# Patient Record
Sex: Male | Born: 1970 | Race: White | Hispanic: No | Marital: Married
Health system: Southern US, Community
[De-identification: ages and names within clinical notes are randomized; demographics above are authoritative.]

---

## 2019-03-17 ENCOUNTER — Encounter (HOSPITAL_COMMUNITY): Payer: Self-pay | Admitting: Student

## 2019-03-17 ENCOUNTER — Emergency Department (HOSPITAL_COMMUNITY): Payer: Managed Care, Other (non HMO)

## 2019-03-17 ENCOUNTER — Emergency Department (HOSPITAL_COMMUNITY)
Admission: EM | Admit: 2019-03-17 | Discharge: 2019-03-17 | Disposition: A | Discharge status: Home / Self Care | Payer: Managed Care, Other (non HMO) | Attending: Emergency Medicine | Admitting: Emergency Medicine

## 2019-03-17 DIAGNOSIS — R7989 Other specified abnormal findings of blood chemistry: Secondary | ICD-10-CM | POA: Insufficient documentation

## 2019-03-17 DIAGNOSIS — R509 Fever, unspecified: Secondary | ICD-10-CM | POA: Insufficient documentation

## 2019-03-17 DIAGNOSIS — R0602 Shortness of breath: Secondary | ICD-10-CM | POA: Diagnosis present

## 2019-03-17 DIAGNOSIS — U071 COVID-19: Secondary | ICD-10-CM | POA: Diagnosis not present

## 2019-03-17 LAB — BASIC METABOLIC PANEL
Anion gap: 13 (ref 5–15)
BUN: 14 mg/dL (ref 6–20)
CO2: 23 mmol/L (ref 22–32)
Calcium: 9.1 mg/dL (ref 8.9–10.3)
Chloride: 100 mmol/L (ref 98–111)
Creatinine, Ser: 1.53 mg/dL — ABNORMAL HIGH (ref 0.61–1.24)
GFR calc Af Amer: >60 mL/min (ref >60)
GFR calc non Af Amer: 52 mL/min — ABNORMAL LOW (ref >60)
Glucose, Bld: 94 mg/dL (ref 70–99)
Potassium: 3.9 mmol/L (ref 3.5–5.1)
Sodium: 136 mmol/L (ref 135–145)

## 2019-03-17 LAB — CBC
HCT: 55.7 % — ABNORMAL HIGH (ref 39.0–52.0)
Hemoglobin: 18.8 g/dL — ABNORMAL HIGH (ref 13.0–17.0)
MCH: 31.1 pg (ref 26.0–34.0)
MCHC: 33.8 g/dL (ref 30.0–36.0)
MCV: 92.1 fL (ref 80.0–100.0)
Platelets: 167 10*3/uL (ref 150–400)
RBC: 6.05 MIL/uL — ABNORMAL HIGH (ref 4.22–5.81)
RDW: 13.2 % (ref 11.5–15.5)
WBC: 6.7 10*3/uL (ref 4.0–10.5)
nRBC: 0 % (ref 0.0–0.2)

## 2019-03-17 LAB — TROPONIN I (HIGH SENSITIVITY)
Troponin I (High Sensitivity): 6 ng/L (ref <18)
Troponin I (High Sensitivity): 5 ng/L (ref <18)

## 2019-03-17 MED ORDER — AEROCHAMBER PLUS FLO-VU MEDIUM MISC
1.0000 | Freq: Once | Status: DC
  Filled 2019-03-17: qty 1

## 2019-03-17 MED ORDER — BENZONATATE 100 MG PO CAPS: 100.0000 mg | ORAL_CAPSULE | Freq: Three times a day (TID) | ORAL | 0 refills | Status: AC | PRN

## 2019-03-17 MED ORDER — ALBUTEROL SULFATE HFA 108 (90 BASE) MCG/ACT IN AERS
2.0000 | INHALATION_SPRAY | Freq: Once | RESPIRATORY_TRACT | Status: AC
  Administered 2019-03-17: 2 via RESPIRATORY_TRACT
  Filled 2019-03-17: qty 6.7

## 2019-03-17 NOTE — ED Provider Notes (Signed)
[name] EMERGENCY DEPARTMENT Provider Note   CSN: [id number] Arrival date & time: 03/17/19  1134     History Chief Complaint  Patient presents with  . Shortness of Breath    Daniel Dillon is a 49 y.o. male with a history of hypercholesterolemia who presents to the ED due to concern for continued fever with known diagnoses of covid 19. Patient states he has been feeling poorly for about 1 week with fever to 102, chills, body aches, nasal congestion, cough, dyspnea with exertion, and chest discomfort described as feeling as though there is a rock in his chest @ times. Other than exertion no alleviating/aggravating factors. Seen @ clinic 01/11- covid swab obtained, sent home with prednisone & azithromycin which he has 2 and 1 day left of respectively which he has been taking without relief. Today received call his COVID test was positive- given he did not feel any better they instructed him to come to the ED. He states it is really that his fever is not going away in terms of sxs not getting better. Denies N/V/D, abdominal pain, syncope,  leg pain/swelling, hemoptysis, recent surgery/trauma, recent long travel, hormone use, personal hx of cancer, or hx of DVT/PE.    HPI     History reviewed. No pertinent past medical history.  There are no problems to display for this patient.   History reviewed. No pertinent surgical history.     History reviewed. No pertinent family history.  Social History   Tobacco Use  . Smoking status: Not on file  Substance Use Topics  . Alcohol use: Not on file  . Drug use: Not on file    Home Medications Prior to Admission medications   Not on File    Allergies    Patient has no allergy information on record.  Review of Systems   Review of Systems  Constitutional: Positive for chills and fever.  HENT: Positive for congestion. Negative for ear pain and sore throat.   Respiratory: Positive for cough and shortness of  breath.   Cardiovascular: Positive for chest pain. Negative for leg swelling.  Gastrointestinal: Negative for abdominal pain, diarrhea, nausea and vomiting.  Genitourinary: Negative for dysuria.  Musculoskeletal: Positive for myalgias (generalized).  Neurological: Negative for syncope.  All other systems reviewed and are negative.   Physical Exam Updated Vital Signs BP 131/89   Pulse 79   Temp 99 F (37.2 C) (Oral)   Resp 14   Ht 5\' 11"  (1.803 m)   Wt 102.1 kg   SpO2 94%   BMI 31.38 kg/m   Physical Exam Vitals and nursing note reviewed.  Constitutional:      General: He is not in acute distress.    Appearance: He is well-developed. He is not toxic-appearing.  HENT:     Head: Normocephalic and atraumatic.     Right Ear: Ear canal normal. Tympanic membrane is not perforated, erythematous, retracted or bulging.     Left Ear: Ear canal normal. Tympanic membrane is not perforated, erythematous, retracted or bulging.     Ears:     Comments: No mastoid erythema/swellng/tenderness.     Nose:     Right Sinus: No maxillary sinus tenderness or frontal sinus tenderness.     Left Sinus: No maxillary sinus tenderness or frontal sinus tenderness.     Mouth/Throat:     Pharynx: Oropharynx is clear. Uvula midline. No oropharyngeal exudate or posterior oropharyngeal erythema.     Comments: Posterior oropharynx is  symmetric appearing. Patient tolerating own secretions without difficulty. No trismus. No drooling. No hot potato voice. No swelling beneath the tongue, submandibular compartment is soft.  Eyes:     General:        Right eye: No discharge.        Left eye: No discharge.     Conjunctiva/sclera: Conjunctivae normal.  Cardiovascular:     Rate and Rhythm: Normal rate and regular rhythm.  Pulmonary:     Effort: Pulmonary effort is normal. No respiratory distress.     Breath sounds: Normal breath sounds. No wheezing, rhonchi or rales.  Abdominal:     General: There is no  distension.     Palpations: Abdomen is soft.     Tenderness: There is no abdominal tenderness.  Musculoskeletal:     Cervical back: Neck supple. No rigidity.     Right lower leg: No tenderness. No edema.     Left lower leg: No tenderness. No edema.  Lymphadenopathy:     Cervical: No cervical adenopathy.  Skin:    General: Skin is warm and dry.     Findings: No rash.  Neurological:     Mental Status: He is alert.  Psychiatric:        Behavior: Behavior normal.    ED Results / Procedures / Treatments   Labs (all labs ordered are listed, but only abnormal results are displayed) Labs Reviewed  CBC - Abnormal; Notable for the following components:      Result Value   RBC 6.05 (*)    Hemoglobin 18.8 (*)    HCT 55.7 (*)    All other components within normal limits  BASIC METABOLIC PANEL - Abnormal; Notable for the following components:   Creatinine, Ser 1.53 (*)    GFR calc non Af Amer 52 (*)    All other components within normal limits  TROPONIN I (HIGH SENSITIVITY)  TROPONIN I (HIGH SENSITIVITY)    EKG None  Radiology DG Chest Port 1 View  Result Date: 03/17/2019 CLINICAL DATA:  COVID positive, shortness of breath EXAM: PORTABLE CHEST 1 VIEW COMPARISON:  None. FINDINGS: Cardiomegaly. No acute appearing airspace opacity. The visualized skeletal structures are unremarkable. IMPRESSION: Cardiomegaly without acute abnormality of the lungs in AP portable projection. Electronically Signed   By: Lauralyn Primes M.D.   On: 03/17/2019 14:19    Procedures Procedures (including critical care time)  Medications Ordered in ED Medications  AeroChamber Plus Flo-Vu Medium MISC 1 each (0 each Other Hold 03/17/19 1309)  albuterol (VENTOLIN HFA) 108 (90 Base) MCG/ACT inhaler 2 puff (2 puffs Inhalation Given 03/17/19 1207)    ED Course  I have reviewed the triage vital signs and the nursing notes.  Pertinent labs & imaging results that were available during my care of the patient were  reviewed by me and considered in my medical decision making (see chart for details).    Daniel Dillon was evaluated in Emergency Department on 03/17/2019 for the symptoms described in the history of present illness. He/she was evaluated in the context of the global COVID-19 pandemic, which necessitated consideration that the patient might be at risk for infection with the SARS-CoV-2 virus that causes COVID-19. Institutional protocols and algorithms that pertain to the evaluation of patients at risk for COVID-19 are in a state of rapid change based on information released by regulatory bodies including the CDC and federal and state organizations. These policies and algorithms were followed during the patient's care in the ED.  MDM Rules/Calculators/A&P                      Patient with known positive COVID-19 testing presents to the emergency department for persistent fever and no significant improvement in his symptoms overall.  Patient is nontoxic-appearing, resting comfortably, vitals without significant abnormality.  Given his chest discomfort plan for EKG, chest x-ray, and labs to include troponin.  EKG: No STEMI CBC: No leukocytosis.  Hemoglobin/hematocrit elevated.  No anemia. BMP: Creatinine elevated, no recent on record, PCP follow-up. Troponin: 6, 5 Chest x-ray: Cardiomegaly without acute abnormality of the lungs and AP portable projection.  Low risk heart pathway score, EKG no STEMI, troponins without elevation- doubt ACS.  Low risk Wells, doubt PE.  No significant infiltrates on chest x-ray.  I personally ambulated patient throughout his exam room with SPO2 maintaining greater than 92%.  Patient overall appears appropriate for discharge home.  We will have him take home inhaler provided in the emergency department and provide Tessalon.  Continue Tylenol at home as needed for fever/discomfort. I discussed results, treatment plan, need for follow-up, and return precautions with the patient.  Provided opportunity for questions, patient confirmed understanding and is in agreement with plan.   Final Clinical Impression(s) / ED Diagnoses Final diagnoses:  [id number]  Elevated serum creatinine    Rx / DC Orders ED Discharge Orders         Ordered    benzonatate (TESSALON) 100 MG capsule  3 times daily PRN     03/17/19 1611           Niklaus Mamaril, [place] R, PA-C 03/17/19 1641    Maudie Flakes, MD 03/21/19 1507

## 2019-03-17 NOTE — ED Triage Notes (Signed)
Pt presents from home for positive covid test, increasing SOB with exertion and CP with associated with cough. Diagnosed positive Monday, sent by PCP for sx possibly indicating PNU, sent for CXR. During triage, pt walked in room with EDP while wearing pulse ox, spo2 remained 93% or greater during this activity.  Denies h/o blood clots, cardiac hx

## 2019-03-17 NOTE — ED Notes (Signed)
Patient verbalizes understanding of discharge instructions. Opportunity for questioning and answers were provided. Armband removed by staff, pt discharged from ED to home with wife 

## 2019-03-17 NOTE — Discharge Instructions (Signed)
You were seen in the emergency department today for coronavirus.  Your work-up was overall reassuring.  Your labs did show that your creatinine (a measure of kidney function) to follow your hemoglobin/hematocrit (blood counts) were each somewhat elevated, this may be due to dehydration, be sure to drink plenty of water, have these rechecked by primary care within 1 to 2 weeks.  Your chest x-ray showed that you had mild cardiomegaly meaning that your heart is mildly enlarged but this is not necessarily an acute problem, also have this rechecked with your primary care follow-up.  Your EKG and heart enzymes did not show signs of a heart attack.  Your chest x-ray did not show pneumonia.  Your oxygen saturations were reassuring in the emergency department.  We are sending you home with inhaler to use 1 to 2 puffs as needed for shortness of breath/wheezing every 4-6 hours as well as Tessalon to use for cough as needed every 8 hours.  We have prescribed you new medication(s) today. Discuss the medications prescribed today with your pharmacist as they can have adverse effects and interactions with your other medicines including over the counter and prescribed medications. Seek medical evaluation if you start to experience new or abnormal symptoms after taking one of these medicines, seek care immediately if you start to experience difficulty breathing, feeling of your throat closing, facial swelling, or rash as these could be indications of a more serious allergic reaction  Given your elevated kidney function please avoid anti-inflammatory such as ibuprofen, Advil, Aleve, Motrin, Goody powder, please take Tylenol instead probably counter dosing to help with discomfort as well as fevers.  We are instructing patient's with COVID 19 or symptoms of COVID 19 to quarantine themselves for 14 days. You may be able to discontinue self quarantine if the following conditions are met:   Persons with COVID-19 who have symptoms  and were directed to care for themselves at home may discontinue home isolation under the  following conditions: - It has been at least 7 days have passed since symptoms first appeared. - AND at least 3 days (72 hours) have passed since recovery defined as resolution of fever without the use of fever-reducing medications and improvement in respiratory symptoms (e.g., cough, shortness of breath)  Please follow the below quarantine instructions.   Please follow up with primary care within 3-5 days for re-evaluation- call prior to going to the office to make them aware of your symptoms as some offices are altering their method of seeing patients with COVID 19 symptoms. Return to the ER for new or worsening symptoms including but not limited to increased work of breathing, chest pain, passing out, or any other concerns.       Person Under Monitoring Name: Daniel Dillon  Location: [place] [address]   Infection Prevention Recommendations for Individuals Confirmed to have, or Being Evaluated for, 2019 Novel Coronavirus (COVID-19) Infection Who Receive Care at Home  Individuals who are confirmed to have, or are being evaluated for, COVID-19 should follow the prevention steps below until a healthcare provider or local or state health department says they can return to normal activities.  Stay home except to get medical care You should restrict activities outside your home, except for getting medical care. Do not go to work, school, or public areas, and do not use public transportation or taxis.  Call ahead before visiting your doctor Before your medical appointment, call the healthcare provider and tell them that you  have, or are being evaluated for, COVID-19 infection. This will help the healthcare provider's office take steps to keep other people from getting infected. Ask your healthcare provider to call the local or state health department.  Monitor your  symptoms Seek prompt medical attention if your illness is worsening (e.g., difficulty breathing). Before going to your medical appointment, call the healthcare provider and tell them that you have, or are being evaluated for, COVID-19 infection. Ask your healthcare provider to call the local or state health department.  Wear a facemask You should wear a facemask that covers your nose and mouth when you are in the same room with other people and when you visit a healthcare provider. People who live with or visit you should also wear a facemask while they are in the same room with you.  Separate yourself from other people in your home As much as possible, you should stay in a different room from other people in your home. Also, you should use a separate bathroom, if available.  Avoid sharing household items You should not share dishes, drinking glasses, cups, eating utensils, towels, bedding, or other items with other people in your home. After using these items, you should wash them thoroughly with soap and water.  Cover your coughs and sneezes Cover your mouth and nose with a tissue when you cough or sneeze, or you can cough or sneeze into your sleeve. Throw used tissues in a lined trash can, and immediately wash your hands with soap and water for at least 20 seconds or use an alcohol-based hand rub.  Wash your [employer] your hands often and thoroughly with soap and water for at least 20 seconds. You can use an alcohol-based hand sanitizer if soap and water are not available and if your hands are not visibly dirty. Avoid touching your eyes, nose, and mouth with unwashed hands.   Prevention Steps for Caregivers and Household Members of Individuals Confirmed to have, or Being Evaluated for, COVID-19 Infection Being Cared for in the Home  If you live with, or provide care at home for, a person confirmed to have, or being evaluated for, COVID-19 infection please follow these guidelines  to prevent infection:  Follow healthcare provider's instructions Make sure that you understand and can help the patient follow any healthcare provider instructions for all care.  Provide for the patient's basic needs You should help the patient with basic needs in the home and provide support for getting groceries, prescriptions, and other personal needs.  Monitor the patient's symptoms If they are getting sicker, call his or her medical provider and tell them that the patient has, or is being evaluated for, COVID-19 infection. This will help the healthcare provider's office take steps to keep other people from getting infected. Ask the healthcare provider to call the local or state health department.  Limit the number of people who have contact with the patient If possible, have only one caregiver for the patient. Other household members should stay in another home or place of residence. If this is not possible, they should stay in another room, or be separated from the patient as much as possible. Use a separate bathroom, if available. Restrict visitors who do not have an essential need to be in the home.  Keep older adults, very young children, and other sick people away from the patient Keep older adults, very young children, and those who have compromised immune systems or chronic health conditions away from the patient. This includes  people with chronic heart, lung, or kidney conditions, diabetes, and cancer.  Ensure good ventilation Make sure that shared spaces in the home have good air flow, such as from an air conditioner or an opened window, weather permitting.  Wash your hands often Wash your hands often and thoroughly with soap and water for at least 20 seconds. You can use an alcohol based hand sanitizer if soap and water are not available and if your hands are not visibly dirty. Avoid touching your eyes, nose, and mouth with unwashed hands. Use disposable paper towels to  dry your hands. If not available, use dedicated cloth towels and replace them when they become wet.  Wear a facemask and gloves Wear a disposable facemask at all times in the room and gloves when you touch or have contact with the patient's blood, body fluids, and/or secretions or excretions, such as sweat, saliva, sputum, nasal mucus, vomit, urine, or feces.  Ensure the mask fits over your nose and mouth tightly, and do not touch it during use. Throw out disposable facemasks and gloves after using them. Do not reuse. Wash your hands immediately after removing your facemask and gloves. If your personal clothing becomes contaminated, carefully remove clothing and launder. Wash your hands after handling contaminated clothing. Place all used disposable facemasks, gloves, and other waste in a lined container before disposing them with other household waste. Remove gloves and wash your hands immediately after handling these items.  Do not share dishes, glasses, or other household items with the patient Avoid sharing household items. You should not share dishes, drinking glasses, cups, eating utensils, towels, bedding, or other items with a patient who is confirmed to have, or being evaluated for, COVID-19 infection. After the person uses these items, you should wash them thoroughly with soap and water.  Wash laundry thoroughly Immediately remove and wash clothes or bedding that have blood, body fluids, and/or secretions or excretions, such as sweat, saliva, sputum, nasal mucus, vomit, urine, or feces, on them. Wear gloves when handling laundry from the patient. Read and follow directions on labels of laundry or clothing items and detergent. In general, wash and dry with the warmest temperatures recommended on the label.  Clean all areas the individual has used often Clean all touchable surfaces, such as counters, tabletops, doorknobs, bathroom fixtures, toilets, phones, keyboards, tablets, and bedside  tables, every day. Also, clean any surfaces that may have blood, body fluids, and/or secretions or excretions on them. Wear gloves when cleaning surfaces the patient has come in contact with. Use a diluted bleach solution (e.g., dilute bleach with 1 part bleach and 10 parts water) or a household disinfectant with a label that says EPA-registered for coronaviruses. To make a bleach solution at home, add 1 tablespoon of bleach to 1 quart (4 cups) of water. For a larger supply, add  cup of bleach to 1 gallon (16 cups) of water. Read labels of cleaning products and follow recommendations provided on product labels. Labels contain instructions for safe and effective use of the cleaning product including precautions you should take when applying the product, such as wearing gloves or eye protection and making sure you have good ventilation during use of the product. Remove gloves and wash hands immediately after cleaning.  Monitor yourself for signs and symptoms of illness Caregivers and household members are considered close contacts, should monitor their health, and will be asked to limit movement outside of the home to the extent possible. Follow the monitoring steps for close  contacts listed on the symptom monitoring form.   ? If you have additional questions, contact your local health department or call the epidemiologist on call at [phone] (available 24/7). ? This guidance is subject to change. For the most up-to-date guidance from [facility], please refer to their website: YouBlogs.pl

## 2020-11-15 IMAGING — DX DG CHEST 1V PORT
1 series · 1 of 1 positions shown · non-contrast
Comparison: None.

CLINICAL DATA: COVID positive, shortness of breath

EXAM:
PORTABLE CHEST 1 VIEW

[chest]
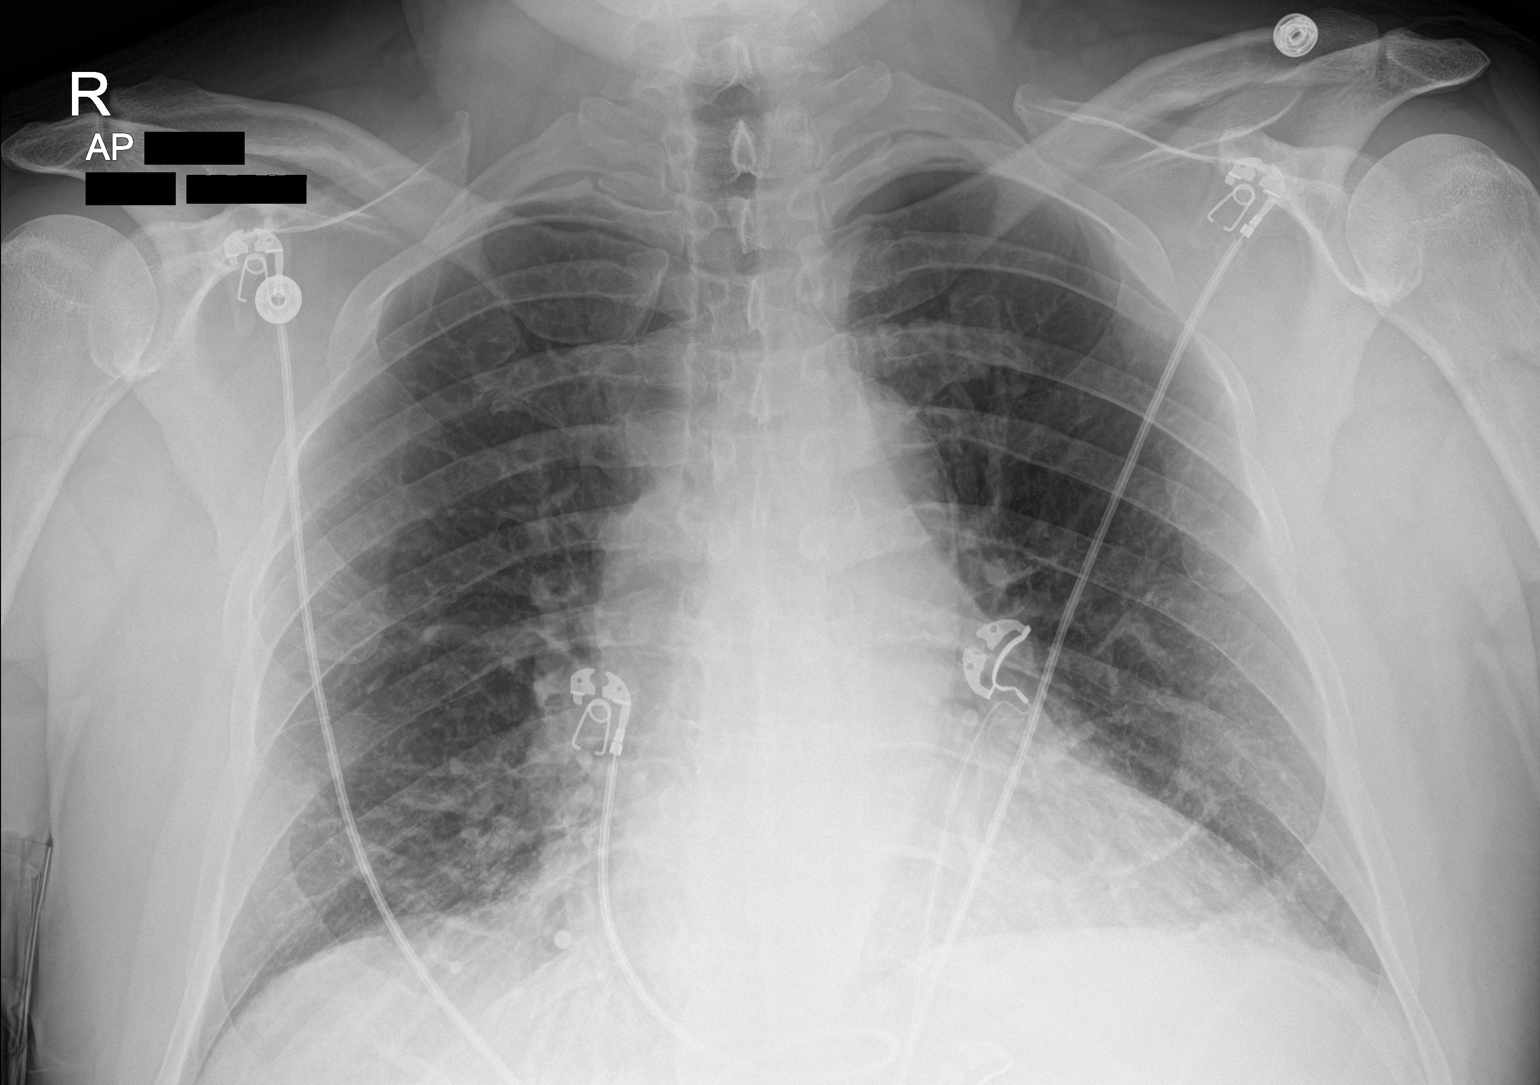

[1 of 1 positions shown; findings below may reference images not displayed]

FINDINGS: Cardiomegaly. No acute appearing airspace opacity. The visualized
skeletal structures are unremarkable.
IMPRESSION: Cardiomegaly without acute abnormality of the lungs in AP portable
projection.
